# Patient Record
Sex: Male | Born: 2000 | Hispanic: Refuse to answer | Marital: Single | Smoking: Never smoker
Health system: Southern US, Community
[De-identification: ages and names within clinical notes are randomized; demographics above are authoritative.]

---
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
## 2020-04-02 DIAGNOSIS — B338 Other specified viral diseases: Secondary | ICD-10-CM | POA: Diagnosis not present

## 2021-07-19 ENCOUNTER — Emergency Department (HOSPITAL_COMMUNITY)
Admission: EM | Admit: 2021-07-19 | Discharge: 2021-07-19 | Disposition: A | Discharge status: Home / Self Care | Payer: Medicaid Other | Attending: Emergency Medicine | Admitting: Emergency Medicine

## 2021-07-19 ENCOUNTER — Other Ambulatory Visit: Payer: Self-pay

## 2021-07-19 ENCOUNTER — Encounter (HOSPITAL_COMMUNITY): Payer: Self-pay | Admitting: Emergency Medicine

## 2021-07-19 DIAGNOSIS — Z20822 Contact with and (suspected) exposure to covid-19: Secondary | ICD-10-CM | POA: Insufficient documentation

## 2021-07-19 DIAGNOSIS — R112 Nausea with vomiting, unspecified: Secondary | ICD-10-CM | POA: Diagnosis not present

## 2021-07-19 LAB — CBC WITH DIFFERENTIAL/PLATELET
Abs Immature Granulocytes: 0.03 10*3/uL (ref 0.00–0.07)
Basophils Absolute: 0 10*3/uL (ref 0.0–0.1)
Basophils Relative: 0 %
Eosinophils Absolute: 0 10*3/uL (ref 0.0–0.5)
Eosinophils Relative: 0 %
HCT: 50.2 % (ref 39.0–52.0)
Hemoglobin: 17.1 g/dL — ABNORMAL HIGH (ref 13.0–17.0)
Immature Granulocytes: 0 %
Lymphocytes Relative: 8 %
Lymphs Abs: 1 10*3/uL (ref 0.7–4.0)
MCH: 30.3 pg (ref 26.0–34.0)
MCHC: 34.1 g/dL (ref 30.0–36.0)
MCV: 89 fL (ref 80.0–100.0)
Monocytes Absolute: 0.6 10*3/uL (ref 0.1–1.0)
Monocytes Relative: 6 %
Neutro Abs: 9.7 10*3/uL — ABNORMAL HIGH (ref 1.7–7.7)
Neutrophils Relative %: 86 %
Platelets: 263 10*3/uL (ref 150–400)
RBC: 5.64 MIL/uL (ref 4.22–5.81)
RDW: 11.7 % (ref 11.5–15.5)
WBC: 11.4 10*3/uL — ABNORMAL HIGH (ref 4.0–10.5)
nRBC: 0 % (ref 0.0–0.2)

## 2021-07-19 LAB — COMPREHENSIVE METABOLIC PANEL
ALT: 46 U/L — ABNORMAL HIGH (ref 0–44)
AST: 23 U/L (ref 15–41)
Albumin: 4.4 g/dL (ref 3.5–5.0)
Alkaline Phosphatase: 49 U/L (ref 38–126)
Anion gap: 10 (ref 5–15)
BUN: 20 mg/dL (ref 6–20)
CO2: 25 mmol/L (ref 22–32)
Calcium: 9.6 mg/dL (ref 8.9–10.3)
Chloride: 102 mmol/L (ref 98–111)
Creatinine, Ser: 1.02 mg/dL (ref 0.61–1.24)
GFR, Estimated: >60 mL/min (ref >60)
Glucose, Bld: 104 mg/dL — ABNORMAL HIGH (ref 70–99)
Potassium: 4.1 mmol/L (ref 3.5–5.1)
Sodium: 137 mmol/L (ref 135–145)
Total Bilirubin: 1.2 mg/dL (ref 0.3–1.2)
Total Protein: 7.5 g/dL (ref 6.5–8.1)

## 2021-07-19 LAB — RESP PANEL BY RT-PCR (FLU A&B, COVID) ARPGX2
Influenza A by PCR: NEGATIVE
Influenza B by PCR: NEGATIVE
SARS Coronavirus 2 by RT PCR: NEGATIVE

## 2021-07-19 LAB — LIPASE, BLOOD: Lipase: 28 U/L (ref 11–51)

## 2021-07-19 MED ORDER — ALUM & MAG HYDROXIDE-SIMETH 200-200-20 MG/5ML PO SUSP
30.0000 mL | Freq: Once | ORAL | Status: AC
  Administered 2021-07-19: 30 mL via ORAL
  Filled 2021-07-19: qty 30

## 2021-07-19 MED ORDER — ONDANSETRON 4 MG PO TBDP: 4.0000 mg | ORAL_TABLET | Freq: Once | ORAL | Status: DC

## 2021-07-19 MED ORDER — ONDANSETRON 4 MG PO TBDP: ORAL_TABLET | ORAL | 0 refills | Status: AC

## 2021-07-19 MED ORDER — ONDANSETRON 4 MG PO TBDP
4.0000 mg | ORAL_TABLET | Freq: Once | ORAL | Status: AC
  Administered 2021-07-19: 4 mg via ORAL
  Filled 2021-07-19: qty 1

## 2021-07-19 NOTE — ED Provider Notes (Signed)
Emergency Medicine Provider Triage Evaluation Note  Tim Hubbard , a 20 y.o. male  was evaluated in triage.  Pt complains of abd pain.  Review of Systems  Positive: LUQ pain, nausea, vomiting Negative: Fever, cp, cough, dysuria, diarrhea, constipation  Physical Exam  BP 122/84 (BP Location: Right Arm)   Pulse 95   Temp 97.7 F (36.5 C) (Oral)   Resp 17   SpO2 98%  Gen:   Awake, no distress   Resp:  Normal effort  MSK:   Moves extremities without difficulty  Other:    Medical Decision Making  Medically screening exam initiated at 12:04 PM.  Appropriate orders placed.  HANZ WINTERHALTER was informed that the remainder of the evaluation will be completed by another provider, this initial triage assessment does not replace that evaluation, and the importance of remaining in the ED until their evaluation is complete.  Intermittent LUQ pain since yesterday, now having nausea and vomiting.  Denies alcohol or tobacco abuse.  Denies cold sxs.     Fayrene Helper, PA-C 07/19/21 1205    Terald Sleeper, MD 07/19/21 [phone]

## 2021-07-19 NOTE — ED Triage Notes (Signed)
Pt reports LUQ pain since yesterday with nausea and vomiting today.

## 2021-07-19 NOTE — ED Provider Notes (Signed)
[facility]ER[facility] DEPARTMENT Provider Note   CSN: [id number] Arrival date & time: 07/19/21  1156     History No chief complaint on file.   Tim Hubbard is a 20 y.o. male.  20 yo M with a chief complaints of nausea and vomiting.  Going on since yesterday.  Has some mild abdominal discomfort prior and then usually feels a little better afterwards.  Has been able to eat and drink a bit with this.  Denies fevers or chills.  Has multiple family members that he saw for Thanksgiving that are also sick and throwing up.  Denies any diarrhea.  The history is provided by the patient and the spouse.  Illness Severity:  Moderate Onset quality:  Gradual Duration:  1 day Timing:  Constant Progression:  Unchanged Chronicity:  New Associated symptoms: nausea and vomiting   Associated symptoms: no abdominal pain, no chest pain, no congestion, no diarrhea, no fever, no headaches, no myalgias, no rash and no shortness of breath       History reviewed. No pertinent past medical history.  There are no problems to display for this patient.   History reviewed. No pertinent surgical history.     No family history on file.  Social History   Tobacco Use   Smoking status: Never   Smokeless tobacco: Never  Substance Use Topics   Alcohol use: No   Drug use: No    Home Medications Prior to Admission medications   Medication Sig Start Date End Date Taking? Authorizing Provider  ondansetron (ZOFRAN-ODT) 4 MG disintegrating tablet 4mg  ODT q4 hours prn nausea/vomit 07/19/21  Yes Deno Etienne, DO  ibuprofen (ADVIL,MOTRIN) 600 MG tablet Take 1 tablet (600 mg total) by mouth every 6 (six) hours as needed. 01/23/18   Ward, Ozella Almond, PA-C  mupirocin ointment (BACTROBAN) 2 % Apply 1 application topically 2 (two) times daily. 05/28/14   Gregor Hams, MD  Triamcinolone Acetonide (TRIAMCINOLONE 0.1 % CREAM : EUCERIN) CREA Apply 1 application topically 3 (three) times daily as  needed for itching. 1:1 ratio dispense 1 pound jar 05/28/14   Gregor Hams, MD    Allergies    Patient has no known allergies.  Review of Systems   Review of Systems  Constitutional:  Negative for chills and fever.  HENT:  Negative for congestion and facial swelling.   Eyes:  Negative for discharge and visual disturbance.  Respiratory:  Negative for shortness of breath.   Cardiovascular:  Negative for chest pain and palpitations.  Gastrointestinal:  Positive for nausea and vomiting. Negative for abdominal pain and diarrhea.  Musculoskeletal:  Negative for arthralgias and myalgias.  Skin:  Negative for color change and rash.  Neurological:  Negative for tremors, syncope and headaches.  Psychiatric/Behavioral:  Negative for confusion and dysphoric mood.    Physical Exam Updated Vital Signs BP 122/84 (BP Location: Right Arm)   Pulse 95   Temp 97.7 F (36.5 C) (Oral)   Resp 17   SpO2 98%   Physical Exam Vitals and nursing note reviewed.  Constitutional:      Appearance: He is well-developed.  HENT:     Head: Normocephalic and atraumatic.  Eyes:     Pupils: Pupils are equal, round, and reactive to light.  Neck:     Vascular: No JVD.  Cardiovascular:     Rate and Rhythm: Normal rate and regular rhythm.     Heart sounds: No murmur heard.   No friction rub. No  gallop.  Pulmonary:     Effort: No respiratory distress.     Breath sounds: No wheezing.  Abdominal:     General: There is no distension.     Tenderness: There is no abdominal tenderness. There is no guarding or rebound.  Musculoskeletal:        General: Normal range of motion.     Cervical back: Normal range of motion and neck supple.  Skin:    Coloration: Skin is not pale.     Findings: No rash.  Neurological:     Mental Status: He is alert and oriented to person, place, and time.  Psychiatric:        Behavior: Behavior normal.    ED Results / Procedures / Treatments   Labs (all labs ordered are listed,  but only abnormal results are displayed) Labs Reviewed  CBC WITH DIFFERENTIAL/PLATELET - Abnormal; Notable for the following components:      Result Value   WBC 11.4 (*)    Hemoglobin 17.1 (*)    Neutro Abs 9.7 (*)    All other components within normal limits  COMPREHENSIVE METABOLIC PANEL - Abnormal; Notable for the following components:   Glucose, Bld 104 (*)    ALT 46 (*)    All other components within normal limits  RESP PANEL BY RT-PCR (FLU A&B, COVID) ARPGX2  LIPASE, BLOOD  URINALYSIS, ROUTINE W REFLEX MICROSCOPIC    EKG None  Radiology No results found.  Procedures Procedures   Medications Ordered in ED Medications  ondansetron (ZOFRAN-ODT) disintegrating tablet 4 mg (has no administration in time range)  ondansetron (ZOFRAN-ODT) disintegrating tablet 4 mg (4 mg Oral Given 07/19/21 1436)  alum & mag hydroxide-simeth (MAALOX/MYLANTA) 200-200-20 MG/5ML suspension 30 mL (30 mLs Oral Given 07/19/21 1436)    ED Course  I have reviewed the triage vital signs and the nursing notes.  Pertinent labs & imaging results that were available during my care of the patient were reviewed by me and considered in my medical decision making (see chart for details).    MDM Rules/Calculators/A&P                           20 yo M with a chief complaints of nausea and vomiting.  This has been going on since yesterday.  Has benign abdominal exam for me.  LFTs and lipase are unremarkable.  Mild leukocytosis.  Will give Zofran oral trial reassess  Oral trial without issue.  PCP follow-up.  3:05 PM:  I have discussed the diagnosis/risks/treatment options with the patient and believe the pt to be eligible for discharge home to follow-up with PCP. We also discussed returning to the ED immediately if new or worsening sx occur. We discussed the sx which are most concerning (e.g., sudden worsening pain, fever, inability to tolerate by mouth) that necessitate immediate return. Medications  administered to the patient during their visit and any new prescriptions provided to the patient are listed below.  Medications given during this visit Medications  ondansetron (ZOFRAN-ODT) disintegrating tablet 4 mg (has no administration in time range)  ondansetron (ZOFRAN-ODT) disintegrating tablet 4 mg (4 mg Oral Given 07/19/21 1436)  alum & mag hydroxide-simeth (MAALOX/MYLANTA) 200-200-20 MG/5ML suspension 30 mL (30 mLs Oral Given 07/19/21 1436)     The patient appears reasonably screen and/or stabilized for discharge and I doubt any other medical condition or other [facility] requiring further screening, evaluation, or treatment in the ED at this time  prior to discharge.   Final Clinical Impression(s) / ED Diagnoses Final diagnoses:  Nausea and vomiting in adult    Rx / DC Orders ED Discharge Orders          Ordered    ondansetron (ZOFRAN-ODT) 4 MG disintegrating tablet        07/19/21 1453             Melene Plan, DO 07/19/21 1505

## 2021-07-19 NOTE — Discharge Instructions (Addendum)
Return for worsening pain, fever or inability to eat or drink.  You should try to wash your hands with soap and water prior to handling your child to try and limit your chances of getting them sick.  Use the nausea medicine as needed at home.  Try to initially start with the brat diet, bananas rice applesauce and toast.  If you tolerate this well with your next meal then you can try to advance slowly to something like soup.  Try to avoid fatty foods spicy foods tomato-based products chocolate or peppermint for at least the next day.

## 2021-07-30 DIAGNOSIS — R509 Fever, unspecified: Secondary | ICD-10-CM | POA: Diagnosis not present

## 2021-07-30 DIAGNOSIS — Z20828 Contact with and (suspected) exposure to other viral communicable diseases: Secondary | ICD-10-CM | POA: Diagnosis not present

## 2021-07-30 DIAGNOSIS — J111 Influenza due to unidentified influenza virus with other respiratory manifestations: Secondary | ICD-10-CM | POA: Diagnosis not present

## 2021-09-30 DIAGNOSIS — H6123 Impacted cerumen, bilateral: Secondary | ICD-10-CM | POA: Diagnosis not present

## 2022-08-24 DIAGNOSIS — Z419 Encounter for procedure for purposes other than remedying health state, unspecified: Secondary | ICD-10-CM | POA: Diagnosis not present

## 2022-09-24 DIAGNOSIS — Z419 Encounter for procedure for purposes other than remedying health state, unspecified: Secondary | ICD-10-CM | POA: Diagnosis not present

## 2022-10-23 DIAGNOSIS — Z419 Encounter for procedure for purposes other than remedying health state, unspecified: Secondary | ICD-10-CM | POA: Diagnosis not present

## 2022-11-23 DIAGNOSIS — Z419 Encounter for procedure for purposes other than remedying health state, unspecified: Secondary | ICD-10-CM | POA: Diagnosis not present

## 2022-12-23 DIAGNOSIS — Z419 Encounter for procedure for purposes other than remedying health state, unspecified: Secondary | ICD-10-CM | POA: Diagnosis not present

## 2023-01-23 DIAGNOSIS — Z419 Encounter for procedure for purposes other than remedying health state, unspecified: Secondary | ICD-10-CM | POA: Diagnosis not present

## 2023-02-22 DIAGNOSIS — Z419 Encounter for procedure for purposes other than remedying health state, unspecified: Secondary | ICD-10-CM | POA: Diagnosis not present

## 2023-03-25 DIAGNOSIS — Z419 Encounter for procedure for purposes other than remedying health state, unspecified: Secondary | ICD-10-CM | POA: Diagnosis not present

## 2023-04-25 DIAGNOSIS — Z419 Encounter for procedure for purposes other than remedying health state, unspecified: Secondary | ICD-10-CM | POA: Diagnosis not present
